# Patient Record
Sex: Male | Born: 1945 | Race: White | Hispanic: No | Marital: Married | Smoking: Former smoker
Health system: Southern US, Community
[De-identification: ages and names within clinical notes are randomized; demographics above are authoritative.]

## PROBLEM LIST (undated history)

## (undated) DIAGNOSIS — I10 Essential (primary) hypertension: Secondary | ICD-10-CM

## (undated) DIAGNOSIS — M1990 Unspecified osteoarthritis, unspecified site: Secondary | ICD-10-CM

## (undated) DIAGNOSIS — C801 Malignant (primary) neoplasm, unspecified: Secondary | ICD-10-CM

## (undated) HISTORY — PX: PROSTATECTOMY: SHX69

## (undated) HISTORY — PX: APPENDECTOMY: SHX54

## (undated) HISTORY — PX: HERNIA REPAIR: SHX51

## (undated) HISTORY — PX: SKIN CANCER EXCISION: SHX779

## (undated) HISTORY — PX: NASAL POLYP SURGERY: SHX186

---

## 2015-08-08 ENCOUNTER — Other Ambulatory Visit (HOSPITAL_COMMUNITY): Payer: Self-pay | Admitting: Neurological Surgery

## 2015-08-17 ENCOUNTER — Encounter (HOSPITAL_COMMUNITY)
Admission: RE | Admit: 2015-08-17 | Discharge: 2015-08-17 | Disposition: A | Discharge status: Home / Self Care | Payer: Medicare Other | Source: Ambulatory Visit | Attending: Neurological Surgery | Admitting: Neurological Surgery

## 2015-08-17 ENCOUNTER — Ambulatory Visit (HOSPITAL_COMMUNITY)
Admission: RE | Admit: 2015-08-17 | Discharge: 2015-08-17 | Disposition: A | Discharge status: Home / Self Care | Payer: Medicare Other | Source: Ambulatory Visit | Attending: Neurological Surgery | Admitting: Neurological Surgery

## 2015-08-17 ENCOUNTER — Encounter (HOSPITAL_COMMUNITY): Payer: Self-pay

## 2015-08-17 DIAGNOSIS — I4510 Unspecified right bundle-branch block: Secondary | ICD-10-CM | POA: Diagnosis not present

## 2015-08-17 DIAGNOSIS — Z01810 Encounter for preprocedural cardiovascular examination: Secondary | ICD-10-CM | POA: Insufficient documentation

## 2015-08-17 DIAGNOSIS — R9431 Abnormal electrocardiogram [ECG] [EKG]: Secondary | ICD-10-CM | POA: Insufficient documentation

## 2015-08-17 DIAGNOSIS — I700 Atherosclerosis of aorta: Secondary | ICD-10-CM | POA: Insufficient documentation

## 2015-08-17 DIAGNOSIS — Z01812 Encounter for preprocedural laboratory examination: Secondary | ICD-10-CM | POA: Diagnosis not present

## 2015-08-17 DIAGNOSIS — Z01818 Encounter for other preprocedural examination: Secondary | ICD-10-CM | POA: Diagnosis not present

## 2015-08-17 DIAGNOSIS — Z87891 Personal history of nicotine dependence: Secondary | ICD-10-CM | POA: Diagnosis not present

## 2015-08-17 DIAGNOSIS — I441 Atrioventricular block, second degree: Secondary | ICD-10-CM | POA: Insufficient documentation

## 2015-08-17 DIAGNOSIS — M4802 Spinal stenosis, cervical region: Secondary | ICD-10-CM

## 2015-08-17 HISTORY — DX: Malignant (primary) neoplasm, unspecified: C80.1

## 2015-08-17 HISTORY — DX: Essential (primary) hypertension: I10

## 2015-08-17 HISTORY — DX: Unspecified osteoarthritis, unspecified site: M19.90

## 2015-08-17 LAB — CBC WITH DIFFERENTIAL/PLATELET
BASOS ABS: 0.1 10*3/uL (ref 0.0–0.1)
BASOS PCT: 0 %
EOS ABS: 0.2 10*3/uL (ref 0.0–0.7)
EOS PCT: 2 %
HCT: 43.4 % (ref 39.0–52.0)
HEMOGLOBIN: 14.5 g/dL (ref 13.0–17.0)
Lymphocytes Relative: 11 %
Lymphs Abs: 1.6 10*3/uL (ref 0.7–4.0)
MCH: 30.8 pg (ref 26.0–34.0)
MCHC: 33.4 g/dL (ref 30.0–36.0)
MCV: 92.1 fL (ref 78.0–100.0)
Monocytes Absolute: 1 10*3/uL (ref 0.1–1.0)
Monocytes Relative: 7 %
NEUTROS PCT: 80 %
Neutro Abs: 11.3 10*3/uL — ABNORMAL HIGH (ref 1.7–7.7)
PLATELETS: 263 10*3/uL (ref 150–400)
RBC: 4.71 MIL/uL (ref 4.22–5.81)
RDW: 15.3 % (ref 11.5–15.5)
WBC: 14.1 10*3/uL — AB (ref 4.0–10.5)

## 2015-08-17 LAB — BASIC METABOLIC PANEL
Anion gap: 11 (ref 5–15)
BUN: 21 mg/dL — AB (ref 6–20)
CALCIUM: 9.1 mg/dL (ref 8.9–10.3)
CO2: 25 mmol/L (ref 22–32)
CREATININE: 1.22 mg/dL (ref 0.61–1.24)
Chloride: 103 mmol/L (ref 101–111)
GFR, EST NON AFRICAN AMERICAN: 58 mL/min — AB (ref >60)
Glucose, Bld: 107 mg/dL — ABNORMAL HIGH (ref 65–99)
Potassium: 3.7 mmol/L (ref 3.5–5.1)
SODIUM: 139 mmol/L (ref 135–145)

## 2015-08-17 LAB — SURGICAL PCR SCREEN
MRSA, PCR: NEGATIVE
STAPHYLOCOCCUS AUREUS: NEGATIVE

## 2015-08-17 LAB — PROTIME-INR
INR: 1 (ref 0.00–1.49)
PROTHROMBIN TIME: 13.4 s (ref 11.6–15.2)

## 2015-08-17 NOTE — Pre-Procedure Instructions (Signed)
ATILLA BARRACLOUGH  08/17/2015      [id number] - [place], [place] - [place] AT [facility] OF [address] [place] [address] Phone: [phone] Fax: [phone]    Your procedure is scheduled on  Tuesday  08/25/15  Report to [facility] Admitting at 920 A.M.  Call this number if you have problems the morning of surgery:  [phone]   Remember:  Do not eat food or drink liquids after midnight.  Take these medicines the morning of surgery with A SIP OF WATER   Felodipine (plendil), metoprolol (toprol), singulair, prednisone ,potassium                                                                                                           (NO ASPIRIN OR ASPIRIN PRODUCTS, ADVIL/ IBUPROFEN/ MOTRIN, VITAMINS, HERBAL MEDICINES)                                Do not wear jewelry, make-up or nail polish.  Do not wear lotions, powders, or perfumes.  You may wear deodorant.  Do not shave 48 hours prior to surgery.  Men may shave face and neck.  Do not bring valuables to the hospital.  [facility] is not responsible for any belongings or valuables.  Contacts, dentures or bridgework may not be worn into surgery.  Leave your suitcase in the car.  After surgery it may be brought to your room.  For patients admitted to the hospital, discharge time will be determined by your treatment team.  Patients discharged the day of surgery will not be allowed to drive home.   Name and phone number of your driver:    Special instructions:  [place] - Preparing for Surgery  Before surgery, you can play an important role.  Because skin is not sterile, your skin needs to be as free of germs as possible.  You can reduce the number of germs on you skin by washing with CHG (chlorahexidine gluconate) soap before surgery.  CHG is an antiseptic cleaner which kills germs and bonds with the skin to continue killing germs even after washing.  Please DO NOT  use if you have an allergy to CHG or antibacterial soaps.  If your skin becomes reddened/irritated stop using the CHG and inform your nurse when you arrive at Short Stay.  Do not shave (including legs and underarms) for at least 48 hours prior to the first CHG shower.  You may shave your face.  Please follow these instructions carefully:   1.  Shower with CHG Soap the night before surgery and the                                morning of Surgery.  2.  If you choose to wash your hair, wash your hair first as usual with your  normal shampoo.  3.  After you shampoo, rinse your hair and body thoroughly to remove the                      Shampoo.  4.  Use CHG as you would any other liquid soap.  You can apply chg directly       to the skin and wash gently with scrungie or a clean washcloth.  5.  Apply the CHG Soap to your body ONLY FROM THE NECK DOWN.        Do not use on open wounds or open sores.  Avoid contact with your eyes,       ears, mouth and genitals (private parts).  Wash genitals (private parts)       with your normal soap.  6.  Wash thoroughly, paying special attention to the area where your surgery        will be performed.  7.  Thoroughly rinse your body with warm water from the neck down.  8.  DO NOT shower/wash with your normal soap after using and rinsing off       the CHG Soap.  9.  Pat yourself dry with a clean towel.            10.  Wear clean pajamas.            11.  Place clean sheets on your bed the night of your first shower and do not        sleep with pets.  Day of Surgery  Do not apply any lotions/deoderants the morning of surgery.  Please wear clean clothes to the hospital/surgery center.    Please read over the following fact sheets that you were given. Pain Booklet, Coughing and Deep Breathing, MRSA Information and Surgical Site Infection Prevention

## 2015-08-17 NOTE — Progress Notes (Signed)
   08/17/15 1439  OBSTRUCTIVE SLEEP APNEA  Have you ever been diagnosed with sleep apnea through a sleep study? No  Do you snore loudly (loud enough to be heard through closed doors)?  1  Do you often feel tired, fatigued, or sleepy during the daytime (such as falling asleep during driving or talking to someone)? 0  Has anyone observed you stop breathing during your sleep? 0  Do you have, or are you being treated for high blood pressure? 1  BMI more than 35 kg/m2? 0  Age > 50 (1-yes) 1  Neck circumference greater than:Male 16 inches or larger, Male 17inches or larger? 1  Male Gender (Yes=1) 1  Obstructive Sleep Apnea Score 5  Score 5 or greater  Results sent to PCP

## 2015-08-18 NOTE — Progress Notes (Addendum)
Anesthesia Chart Review:  Pt is a 70 year old male scheduled for C5-6, C6-7 ADCF on 08/25/2015 with Dr. Ronnald Ramp.   PCP is Dr. Alma Friendly (care everywhere)  PMH includes:  HTN, prostate cancer, RA. Former smoker. BMI 30.   Medications include: lipitor, felodipine, methotrexate, metoprolol, potassium, predisone, valsartan-hctz.   Preoperative labs reviewed.    Chest x-ray 08/17/15 reviewed.  1. No acute cardiopulmonary abnormality. 2. Some chronic pulmonary hyperinflation and interstitial changes are suspected. Calcified aortic atherosclerosis.  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 By notes in care everywhere, pt had EKG 04/16/14 that showed 1st degree AV block, RBBB, inferior infarct. Attempting to get copy of tracing.   Reviewed case with Dr. Kalman Shan.   If no changes, I anticipate pt can proceed with surgery as scheduled.   Willeen Cass, FNP-BC [facility] Short Stay Surgical Center/Anesthesiology Phone: [phone] 08/23/2015 4:54 PM

## 2015-08-24 MED ORDER — CEFAZOLIN SODIUM-DEXTROSE 2-3 GM-% IV SOLR
2.0000 g | INTRAVENOUS | Status: AC
  Administered 2015-08-25: 2 g via INTRAVENOUS
  Filled 2015-08-24: qty 50

## 2015-08-25 ENCOUNTER — Inpatient Hospital Stay (HOSPITAL_COMMUNITY): Payer: Medicare Other

## 2015-08-25 ENCOUNTER — Encounter (HOSPITAL_COMMUNITY)
Admission: RE | Disposition: A | Discharge status: Home / Self Care | Payer: Self-pay | Source: Ambulatory Visit | Attending: Neurological Surgery

## 2015-08-25 ENCOUNTER — Inpatient Hospital Stay (HOSPITAL_COMMUNITY)
Admission: RE | Admit: 2015-08-25 | Discharge: 2015-08-26 | DRG: 473 | Disposition: A | Discharge status: Home / Self Care | Payer: Medicare Other | Source: Ambulatory Visit | Attending: Neurological Surgery | Admitting: Neurological Surgery

## 2015-08-25 ENCOUNTER — Encounter (HOSPITAL_COMMUNITY): Payer: Self-pay | Admitting: Surgery

## 2015-08-25 ENCOUNTER — Inpatient Hospital Stay (HOSPITAL_COMMUNITY): Payer: Medicare Other | Admitting: Emergency Medicine

## 2015-08-25 ENCOUNTER — Inpatient Hospital Stay (HOSPITAL_COMMUNITY): Payer: Medicare Other | Admitting: Certified Registered"

## 2015-08-25 DIAGNOSIS — Z87891 Personal history of nicotine dependence: Secondary | ICD-10-CM | POA: Diagnosis not present

## 2015-08-25 DIAGNOSIS — Z886 Allergy status to analgesic agent status: Secondary | ICD-10-CM

## 2015-08-25 DIAGNOSIS — M50222 Other cervical disc displacement at C5-C6 level: Secondary | ICD-10-CM | POA: Diagnosis present

## 2015-08-25 DIAGNOSIS — M47812 Spondylosis without myelopathy or radiculopathy, cervical region: Principal | ICD-10-CM | POA: Diagnosis present

## 2015-08-25 DIAGNOSIS — I10 Essential (primary) hypertension: Secondary | ICD-10-CM | POA: Diagnosis present

## 2015-08-25 DIAGNOSIS — Z7952 Long term (current) use of systemic steroids: Secondary | ICD-10-CM | POA: Diagnosis not present

## 2015-08-25 DIAGNOSIS — Z419 Encounter for procedure for purposes other than remedying health state, unspecified: Secondary | ICD-10-CM

## 2015-08-25 DIAGNOSIS — M4802 Spinal stenosis, cervical region: Secondary | ICD-10-CM | POA: Diagnosis present

## 2015-08-25 DIAGNOSIS — M50223 Other cervical disc displacement at C6-C7 level: Secondary | ICD-10-CM | POA: Diagnosis present

## 2015-08-25 DIAGNOSIS — Z8546 Personal history of malignant neoplasm of prostate: Secondary | ICD-10-CM

## 2015-08-25 DIAGNOSIS — M069 Rheumatoid arthritis, unspecified: Secondary | ICD-10-CM | POA: Diagnosis not present

## 2015-08-25 DIAGNOSIS — Z981 Arthrodesis status: Secondary | ICD-10-CM

## 2015-08-25 DIAGNOSIS — M542 Cervicalgia: Secondary | ICD-10-CM | POA: Diagnosis present

## 2015-08-25 HISTORY — PX: ANTERIOR CERVICAL DECOMP/DISCECTOMY FUSION: SHX1161

## 2015-08-25 SURGERY — ANTERIOR CERVICAL DECOMPRESSION/DISCECTOMY FUSION 2 LEVELS
Anesthesia: General

## 2015-08-25 MED ORDER — NEOSTIGMINE METHYLSULFATE 10 MG/10ML IV SOLN
INTRAVENOUS | Status: DC | PRN
  Administered 2015-08-25: 2 mg via INTRAVENOUS

## 2015-08-25 MED ORDER — POTASSIUM CHLORIDE IN NACL 20-0.9 MEQ/L-% IV SOLN
INTRAVENOUS | Status: DC
  Filled 2015-08-25 (×3): qty 1000

## 2015-08-25 MED ORDER — ESMOLOL HCL 100 MG/10ML IV SOLN
INTRAVENOUS | Status: AC
  Filled 2015-08-25: qty 10

## 2015-08-25 MED ORDER — ACETAMINOPHEN 325 MG PO TABS: 650.0000 mg | ORAL_TABLET | ORAL | Status: DC | PRN

## 2015-08-25 MED ORDER — HYDROMORPHONE HCL 1 MG/ML IJ SOLN
0.2500 mg | INTRAMUSCULAR | Status: DC | PRN
  Administered 2015-08-25 (×3): 0.5 mg via INTRAVENOUS

## 2015-08-25 MED ORDER — CEFAZOLIN SODIUM 1-5 GM-% IV SOLN
1.0000 g | Freq: Three times a day (TID) | INTRAVENOUS | Status: AC
  Administered 2015-08-25 – 2015-08-26 (×2): 1 g via INTRAVENOUS
  Filled 2015-08-25 (×2): qty 50

## 2015-08-25 MED ORDER — ONDANSETRON HCL 4 MG/2ML IJ SOLN
INTRAMUSCULAR | Status: AC
  Filled 2015-08-25: qty 2

## 2015-08-25 MED ORDER — SODIUM CHLORIDE 0.9% FLUSH
3.0000 mL | Freq: Two times a day (BID) | INTRAVENOUS | Status: DC
  Administered 2015-08-25: 3 mL via INTRAVENOUS

## 2015-08-25 MED ORDER — LIDOCAINE HCL (CARDIAC) 20 MG/ML IV SOLN
INTRAVENOUS | Status: AC
  Filled 2015-08-25: qty 5

## 2015-08-25 MED ORDER — GLYCOPYRROLATE 0.2 MG/ML IJ SOLN
INTRAMUSCULAR | Status: AC
  Filled 2015-08-25: qty 2

## 2015-08-25 MED ORDER — FENTANYL CITRATE (PF) 100 MCG/2ML IJ SOLN
INTRAMUSCULAR | Status: DC | PRN
  Administered 2015-08-25: 150 ug via INTRAVENOUS
  Administered 2015-08-25 (×2): 100 ug via INTRAVENOUS

## 2015-08-25 MED ORDER — ALBUTEROL SULFATE HFA 108 (90 BASE) MCG/ACT IN AERS
INHALATION_SPRAY | RESPIRATORY_TRACT | Status: DC | PRN
  Administered 2015-08-25: 2 via RESPIRATORY_TRACT

## 2015-08-25 MED ORDER — PROPOFOL 10 MG/ML IV BOLUS
INTRAVENOUS | Status: AC
  Filled 2015-08-25: qty 20

## 2015-08-25 MED ORDER — PHENOL 1.4 % MT LIQD: 1.0000 | OROMUCOSAL | Status: DC | PRN

## 2015-08-25 MED ORDER — LIDOCAINE HCL 4 % EX SOLN
CUTANEOUS | Status: DC | PRN
  Administered 2015-08-25: 4 mL via TOPICAL

## 2015-08-25 MED ORDER — ROCURONIUM BROMIDE 100 MG/10ML IV SOLN
INTRAVENOUS | Status: DC | PRN
  Administered 2015-08-25: 50 mg via INTRAVENOUS

## 2015-08-25 MED ORDER — OXYCODONE-ACETAMINOPHEN 5-325 MG PO TABS: 1.0000 | ORAL_TABLET | ORAL | Status: DC | PRN

## 2015-08-25 MED ORDER — NEOSTIGMINE METHYLSULFATE 10 MG/10ML IV SOLN
INTRAVENOUS | Status: AC
  Filled 2015-08-25: qty 1

## 2015-08-25 MED ORDER — SODIUM CHLORIDE 0.9 % IR SOLN
Status: DC | PRN
  Administered 2015-08-25: 11:00:00

## 2015-08-25 MED ORDER — DEXAMETHASONE SODIUM PHOSPHATE 10 MG/ML IJ SOLN
10.0000 mg | INTRAMUSCULAR | Status: AC
  Administered 2015-08-25: 10 mg via INTRAVENOUS
  Filled 2015-08-25: qty 1

## 2015-08-25 MED ORDER — DEXTROSE 5 % IV SOLN
500.0000 mg | Freq: Four times a day (QID) | INTRAVENOUS | Status: DC | PRN
  Filled 2015-08-25: qty 5

## 2015-08-25 MED ORDER — ATORVASTATIN CALCIUM 20 MG PO TABS
10.0000 mg | ORAL_TABLET | Freq: Every day | ORAL | Status: DC
  Administered 2015-08-25: 10 mg via ORAL
  Filled 2015-08-25: qty 1

## 2015-08-25 MED ORDER — GELATIN ABSORBABLE MT POWD
OROMUCOSAL | Status: DC | PRN
  Administered 2015-08-25: 11:00:00 via TOPICAL

## 2015-08-25 MED ORDER — HYDROCODONE-ACETAMINOPHEN 5-325 MG PO TABS
1.0000 | ORAL_TABLET | ORAL | Status: DC | PRN
  Administered 2015-08-25 – 2015-08-26 (×2): 2 via ORAL
  Filled 2015-08-25 (×2): qty 2

## 2015-08-25 MED ORDER — MONTELUKAST SODIUM 10 MG PO TABS
10.0000 mg | ORAL_TABLET | Freq: Every day | ORAL | Status: DC
  Filled 2015-08-25: qty 1

## 2015-08-25 MED ORDER — FELODIPINE ER 10 MG PO TB24
10.0000 mg | ORAL_TABLET | Freq: Every day | ORAL | Status: DC
  Administered 2015-08-26: 10 mg via ORAL
  Filled 2015-08-25 (×2): qty 1

## 2015-08-25 MED ORDER — VALSARTAN-HYDROCHLOROTHIAZIDE 320-25 MG PO TABS: 1.0000 | ORAL_TABLET | Freq: Every day | ORAL | Status: DC

## 2015-08-25 MED ORDER — PROMETHAZINE HCL 25 MG/ML IJ SOLN: 6.2500 mg | INTRAMUSCULAR | Status: DC | PRN

## 2015-08-25 MED ORDER — IRBESARTAN 300 MG PO TABS
300.0000 mg | ORAL_TABLET | Freq: Every day | ORAL | Status: DC
  Administered 2015-08-25 – 2015-08-26 (×2): 300 mg via ORAL
  Filled 2015-08-25 (×3): qty 1

## 2015-08-25 MED ORDER — THROMBIN 5000 UNITS EX SOLR
CUTANEOUS | Status: DC | PRN
  Administered 2015-08-25 (×2): 5000 [IU] via TOPICAL

## 2015-08-25 MED ORDER — MENTHOL 3 MG MT LOZG: 1.0000 | LOZENGE | OROMUCOSAL | Status: DC | PRN

## 2015-08-25 MED ORDER — SODIUM CHLORIDE 0.9% FLUSH: 3.0000 mL | INTRAVENOUS | Status: DC | PRN

## 2015-08-25 MED ORDER — ALBUTEROL SULFATE HFA 108 (90 BASE) MCG/ACT IN AERS
INHALATION_SPRAY | RESPIRATORY_TRACT | Status: AC
  Filled 2015-08-25: qty 6.7

## 2015-08-25 MED ORDER — PROPOFOL 10 MG/ML IV BOLUS
INTRAVENOUS | Status: DC | PRN
  Administered 2015-08-25: 200 mg via INTRAVENOUS

## 2015-08-25 MED ORDER — ONDANSETRON HCL 4 MG/2ML IJ SOLN
INTRAMUSCULAR | Status: DC | PRN
  Administered 2015-08-25: 4 mg via INTRAVENOUS

## 2015-08-25 MED ORDER — ESMOLOL HCL 100 MG/10ML IV SOLN
INTRAVENOUS | Status: DC | PRN
  Administered 2015-08-25: 20 mg via INTRAVENOUS

## 2015-08-25 MED ORDER — BUPIVACAINE HCL (PF) 0.25 % IJ SOLN
INTRAMUSCULAR | Status: DC | PRN
  Administered 2015-08-25: 4 mL

## 2015-08-25 MED ORDER — 0.9 % SODIUM CHLORIDE (POUR BTL) OPTIME
TOPICAL | Status: DC | PRN
  Administered 2015-08-25: 1000 mL

## 2015-08-25 MED ORDER — PREDNISONE 10 MG PO TABS: 5.0000 mg | ORAL_TABLET | Freq: Every day | ORAL | Status: DC | PRN

## 2015-08-25 MED ORDER — ROCURONIUM BROMIDE 50 MG/5ML IV SOLN
INTRAVENOUS | Status: AC
  Filled 2015-08-25: qty 1

## 2015-08-25 MED ORDER — METHOCARBAMOL 500 MG PO TABS
500.0000 mg | ORAL_TABLET | Freq: Four times a day (QID) | ORAL | Status: DC | PRN
  Administered 2015-08-25: 500 mg via ORAL
  Filled 2015-08-25: qty 1

## 2015-08-25 MED ORDER — METHOTREXATE 2.5 MG PO TABS: 10.0000 mg | ORAL_TABLET | ORAL | Status: DC

## 2015-08-25 MED ORDER — HEMOSTATIC AGENTS (NO CHARGE) OPTIME
TOPICAL | Status: DC | PRN
  Administered 2015-08-25: 1 via TOPICAL

## 2015-08-25 MED ORDER — HYDROMORPHONE HCL 1 MG/ML IJ SOLN
INTRAMUSCULAR | Status: AC
  Filled 2015-08-25: qty 1

## 2015-08-25 MED ORDER — MORPHINE SULFATE (PF) 2 MG/ML IV SOLN
1.0000 mg | INTRAVENOUS | Status: DC | PRN
  Administered 2015-08-25: 4 mg via INTRAVENOUS
  Filled 2015-08-25: qty 2

## 2015-08-25 MED ORDER — LIDOCAINE HCL (CARDIAC) 20 MG/ML IV SOLN
INTRAVENOUS | Status: DC | PRN
  Administered 2015-08-25: 80 mg via INTRAVENOUS

## 2015-08-25 MED ORDER — MIDAZOLAM HCL 2 MG/2ML IJ SOLN
INTRAMUSCULAR | Status: AC
  Filled 2015-08-25: qty 2

## 2015-08-25 MED ORDER — FENTANYL CITRATE (PF) 250 MCG/5ML IJ SOLN
INTRAMUSCULAR | Status: AC
  Filled 2015-08-25: qty 5

## 2015-08-25 MED ORDER — METOPROLOL SUCCINATE ER 25 MG PO TB24
50.0000 mg | ORAL_TABLET | Freq: Every day | ORAL | Status: DC
  Administered 2015-08-26: 50 mg via ORAL
  Filled 2015-08-25 (×2): qty 2

## 2015-08-25 MED ORDER — ONDANSETRON HCL 4 MG/2ML IJ SOLN: 4.0000 mg | INTRAMUSCULAR | Status: DC | PRN

## 2015-08-25 MED ORDER — HYDROCHLOROTHIAZIDE 25 MG PO TABS
25.0000 mg | ORAL_TABLET | Freq: Every day | ORAL | Status: DC
  Administered 2015-08-25 – 2015-08-26 (×2): 25 mg via ORAL
  Filled 2015-08-25 (×2): qty 1

## 2015-08-25 MED ORDER — ACETAMINOPHEN 650 MG RE SUPP: 650.0000 mg | RECTAL | Status: DC | PRN

## 2015-08-25 MED ORDER — MIDAZOLAM HCL 5 MG/5ML IJ SOLN
INTRAMUSCULAR | Status: DC | PRN
Start: 2015-08-25 — End: 2015-08-25
  Administered 2015-08-25: 2 mg via INTRAVENOUS

## 2015-08-25 MED ORDER — LACTATED RINGERS IV SOLN
INTRAVENOUS | Status: DC
  Administered 2015-08-25 (×2): via INTRAVENOUS

## 2015-08-25 MED ORDER — GLYCOPYRROLATE 0.2 MG/ML IJ SOLN
INTRAMUSCULAR | Status: DC | PRN
  Administered 2015-08-25: 0.2 mg via INTRAVENOUS
  Administered 2015-08-25: 0.3 mg via INTRAVENOUS

## 2015-08-25 SURGICAL SUPPLY — 47 items
BAG DECANTER FOR FLEXI CONT (MISCELLANEOUS) ×3 IMPLANT
BENZOIN TINCTURE PRP APPL 2/3 (GAUZE/BANDAGES/DRESSINGS) ×3 IMPLANT
BIT DRILL POWER (BIT) ×1 IMPLANT
BONE MATRIX OSTEOCEL PRO SM (Bone Implant) ×3 IMPLANT
BUR MATCHSTICK NEURO 3.0 LAGG (BURR) ×3 IMPLANT
CAGE COROENT SM 10X13X15 (Cage) ×2 IMPLANT
CAGE COROENT SM 10X13X15MM (Cage) ×1 IMPLANT
CAGE LORDOTIC 8 SM PLUS (Cage) ×3 IMPLANT
CANISTER SUCT 3000ML PPV (MISCELLANEOUS) ×3 IMPLANT
CLOSURE WOUND 1/2 X4 (GAUZE/BANDAGES/DRESSINGS) ×1
DRAPE C-ARM 42X72 X-RAY (DRAPES) ×6 IMPLANT
DRAPE LAPAROTOMY 100X72 PEDS (DRAPES) ×3 IMPLANT
DRAPE MICROSCOPE LEICA (MISCELLANEOUS) ×3 IMPLANT
DRAPE POUCH INSTRU U-SHP 10X18 (DRAPES) ×3 IMPLANT
DRILL BIT POWER (BIT) ×2
DRSG OPSITE POSTOP 3X4 (GAUZE/BANDAGES/DRESSINGS) ×3 IMPLANT
DURAPREP 6ML APPLICATOR 50/CS (WOUND CARE) ×3 IMPLANT
ELECT COATED BLADE 2.86 ST (ELECTRODE) ×3 IMPLANT
ELECT REM PT RETURN 9FT ADLT (ELECTROSURGICAL) ×3
ELECTRODE REM PT RTRN 9FT ADLT (ELECTROSURGICAL) ×1 IMPLANT
GAUZE SPONGE 4X4 16PLY XRAY LF (GAUZE/BANDAGES/DRESSINGS) IMPLANT
GLOVE BIO SURGEON STRL SZ8 (GLOVE) ×3 IMPLANT
GOWN STRL REUS W/ TWL LRG LVL3 (GOWN DISPOSABLE) IMPLANT
GOWN STRL REUS W/ TWL XL LVL3 (GOWN DISPOSABLE) ×1 IMPLANT
GOWN STRL REUS W/TWL 2XL LVL3 (GOWN DISPOSABLE) IMPLANT
GOWN STRL REUS W/TWL LRG LVL3 (GOWN DISPOSABLE)
GOWN STRL REUS W/TWL XL LVL3 (GOWN DISPOSABLE) ×2
HEMOSTAT POWDER KIT SURGIFOAM (HEMOSTASIS) ×3 IMPLANT
KIT BASIN OR (CUSTOM PROCEDURE TRAY) ×3 IMPLANT
KIT ROOM TURNOVER OR (KITS) ×3 IMPLANT
NEEDLE HYPO 25X1 1.5 SAFETY (NEEDLE) ×3 IMPLANT
NEEDLE SPNL 20GX3.5 QUINCKE YW (NEEDLE) ×3 IMPLANT
NS IRRIG 1000ML POUR BTL (IV SOLUTION) ×3 IMPLANT
PACK LAMINECTOMY NEURO (CUSTOM PROCEDURE TRAY) ×3 IMPLANT
PAD ARMBOARD 7.5X6 YLW CONV (MISCELLANEOUS) ×9 IMPLANT
PIN DISTRACTION 14MM (PIN) ×6 IMPLANT
PLATE ARCHON 2-LEVEL 44MM (Plate) ×3 IMPLANT
RUBBERBAND STERILE (MISCELLANEOUS) ×6 IMPLANT
SCREW ARCHON SELFTAP 4.0X13 (Screw) ×18 IMPLANT
SPONGE INTESTINAL PEANUT (DISPOSABLE) ×3 IMPLANT
SPONGE SURGIFOAM ABS GEL SZ50 (HEMOSTASIS) ×3 IMPLANT
STRIP CLOSURE SKIN 1/2X4 (GAUZE/BANDAGES/DRESSINGS) ×2 IMPLANT
SUT VIC AB 3-0 SH 8-18 (SUTURE) ×6 IMPLANT
TOWEL OR 17X24 6PK STRL BLUE (TOWEL DISPOSABLE) ×3 IMPLANT
TOWEL OR 17X26 10 PK STRL BLUE (TOWEL DISPOSABLE) ×3 IMPLANT
TRAP SPECIMEN MUCOUS 40CC (MISCELLANEOUS) ×3 IMPLANT
WATER STERILE IRR 1000ML POUR (IV SOLUTION) ×3 IMPLANT

## 2015-08-25 NOTE — H&P (Signed)
Subjective:   Patient is a 70 y.o. male admitted for ACDF. The patient first presented to me with complaints of neck pain, shooting pains in the arm(s) and numbness of the arm(s). Onset of symptoms was several months ago. The pain is described as aching and occurs all day. The pain is rated severe, and is located  In the neck and radiates to the RUE. The symptoms have been progressive. Symptoms are exacerbated by extending head backwards, and are relieved by none.  Previous work up includes MRI of cervical spine, results: spinal stenosis.  Past Medical History  Diagnosis Date  . Hypertension   . Arthritis     rheumatoid       [place]  . Cancer [facility])     PROSTATE    Past Surgical History  Procedure Laterality Date  . Prostatectomy      5 YEARS  . Appendectomy    . Hernia repair      3 SURG  . Nasal polyp surgery      X3  . Skin cancer excision      SCALP    Allergies  Allergen Reactions  . Aspirin     Dr prefers for pt not to take    Social History  Substance Use Topics  . Smoking status: Former Research scientist (life sciences)  . Smokeless tobacco: Not on file  . Alcohol Use: No    History reviewed. No pertinent family history. Prior to Admission medications   Medication Sig Start Date End Date Taking? Authorizing Provider  atorvastatin (LIPITOR) 10 MG tablet Take 10 mg by mouth daily.   Yes Historical Provider, MD  felodipine (PLENDIL) 10 MG 24 hr tablet Take 10 mg by mouth daily.   Yes Historical Provider, MD  fluticasone (FLONASE) 50 MCG/ACT nasal spray Place 1 spray into both nostrils daily.   Yes Historical Provider, MD  folic acid (FOLVITE) 1 MG tablet Take 1 mg by mouth daily.   Yes Historical Provider, MD  metoprolol succinate (TOPROL-XL) 50 MG 24 hr tablet Take 1 tablet by mouth daily. 07/29/15  Yes Historical Provider, MD  montelukast (SINGULAIR) 10 MG tablet Take 1 tablet by mouth daily. 07/29/15  Yes Historical Provider, MD  potassium chloride SA (K-DUR,KLOR-CON) 20 MEQ tablet  Take 40 mEq by mouth daily.   Yes Historical Provider, MD  predniSONE (DELTASONE) 5 MG tablet Take 5 mg by mouth daily as needed (Joint pain).   Yes Historical Provider, MD  valsartan-hydrochlorothiazide (DIOVAN-HCT) 320-25 MG tablet Take 1 tablet by mouth daily. 07/24/15  Yes Historical Provider, MD  methotrexate (RHEUMATREX) 2.5 MG tablet Take 10 mg by mouth once a week. Caution:Chemotherapy. Protect from light.    Historical Provider, MD     Review of Systems  Positive ROS: neg  All other systems have been reviewed and were otherwise negative with the exception of those mentioned in the HPI and as above.  Objective: Vital signs in last 24 hours: Temp:  [97.9 F (36.6 C)] 97.9 F (36.6 C) (02/02 0918) Pulse Rate:  [93] 93 (02/02 0918) Resp:  [20] 20 (02/02 0918) BP: (159)/(71) 159/71 mmHg (02/02 0918) SpO2:  [97 %] 97 % (02/02 0918) Weight:  [86.41 kg (190 lb 8 oz)] 86.41 kg (190 lb 8 oz) (02/02 [id number])  General Appearance: Alert, cooperative, no distress, appears stated age Head: Normocephalic, without obvious abnormality, atraumatic Eyes: PERRL, conjunctiva/corneas clear, EOM's intact      Neck: Supple, symmetrical, trachea midline, Back: Symmetric, no curvature, ROM normal,  no CVA tenderness Lungs:  respirations unlabored Heart: Regular rate and rhythm Abdomen: Soft, non-tender Extremities: Extremities normal, atraumatic, no cyanosis or edema Pulses: 2+ and symmetric all extremities Skin: Skin color, texture, turgor normal, no rashes or lesions  NEUROLOGIC:  Mental status: Alert and oriented x4, no aphasia, good attention span, fund of knowledge and memory  Motor Exam - grossly normal Sensory Exam - grossly normal Reflexes: 1= Coordination - grossly normal Gait - grossly normal Balance - grossly normal Cranial Nerves: I: smell Not tested  II: visual acuity  OS: nl    OD: nl  II: visual fields Full to confrontation  II: pupils Equal, round, reactive to light  III,VII:  ptosis None  III,IV,VI: extraocular muscles  Full ROM  V: mastication Normal  V: facial light touch sensation  Normal  V,VII: corneal reflex  Present  VII: facial muscle function - upper  Normal  VII: facial muscle function - lower Normal  VIII: hearing Not tested  IX: soft palate elevation  Normal  IX,X: gag reflex Present  XI: trapezius strength  5/5  XI: sternocleidomastoid strength 5/5  XI: neck flexion strength  5/5  XII: tongue strength  Normal    Data Review Lab Results  Component Value Date   WBC 14.1* 08/17/2015   HGB 14.5 08/17/2015   HCT 43.4 08/17/2015   MCV 92.1 08/17/2015   PLT 263 08/17/2015   Lab Results  Component Value Date   NA 139 08/17/2015   K 3.7 08/17/2015   CL 103 08/17/2015   CO2 25 08/17/2015   BUN 21* 08/17/2015   CREATININE 1.22 08/17/2015   GLUCOSE 107* 08/17/2015   Lab Results  Component Value Date   INR 1.00 08/17/2015    Assessment:   Cervical neck pain with herniated nucleus pulposus/ spondylosis/ stenosis at C5-6, C6-7. Patient has failed conservative therapy. Planned surgery : ACDF C5-6, C6-7  Plan:   I explained the condition and procedure to the patient and answered any questions.  Patient wishes to proceed with procedure as planned. Understands risks/ benefits/ and expected or typical outcomes.  Jeff Lynn S 08/25/2015 10:04 AM

## 2015-08-25 NOTE — Anesthesia Procedure Notes (Signed)
Procedure Name: Intubation Date/Time: 08/25/2015 10:18 AM Performed by: Sampson Si E Pre-anesthesia Checklist: Patient identified, Emergency Drugs available, Suction available, Patient being monitored and Timeout performed Patient Re-evaluated:Patient Re-evaluated prior to inductionOxygen Delivery Method: Circle system utilized Preoxygenation: Pre-oxygenation with 100% oxygen Intubation Type: IV induction Ventilation: Mask ventilation without difficulty and Oral airway inserted - appropriate to patient size Laryngoscope Size: Mac and 3 Grade View: Grade II Tube type: Oral Tube size: 7.5 mm Number of attempts: 1 Airway Equipment and Method: Stylet Placement Confirmation: ETT inserted through vocal cords under direct vision,  positive ETCO2 and breath sounds checked- equal and bilateral Secured at: 22 cm Tube secured with: Tape Dental Injury: Teeth and Oropharynx as per pre-operative assessment

## 2015-08-25 NOTE — Anesthesia Preprocedure Evaluation (Addendum)
Anesthesia Evaluation  Patient identified by MRN, date of birth, ID band Patient awake    Reviewed: Allergy & Precautions, NPO status , Patient's Chart, lab work & pertinent test results  Airway Mallampati: II  TM Distance: >3 FB     Dental  (+) Teeth Intact, Dental Advisory Given   Pulmonary asthma , former smoker,    breath sounds clear to auscultation       Cardiovascular hypertension, Pt. on home beta blockers  Rhythm:Regular Rate:Normal     Neuro/Psych    GI/Hepatic   Endo/Other    Renal/GU      Musculoskeletal  (+) Arthritis , Rheumatoid disorders,    Abdominal   Peds  Hematology   Anesthesia Other Findings   Reproductive/Obstetrics                           Anesthesia Physical Anesthesia Plan  ASA: III  Anesthesia Plan: General   Post-op Pain Management:    Induction: Intravenous  Airway Management Planned: Oral ETT  Additional Equipment:   Intra-op Plan:   Post-operative Plan: Extubation in OR  Informed Consent: I have reviewed the patients History and Physical, chart, labs and discussed the procedure including the risks, benefits and alternatives for the proposed anesthesia with the patient or authorized representative who has indicated his/her understanding and acceptance.   Dental advisory given  Plan Discussed with:   Anesthesia Plan Comments:         Anesthesia Quick Evaluation

## 2015-08-25 NOTE — Anesthesia Postprocedure Evaluation (Signed)
Anesthesia Post Note  Patient: Jeff Lynn  Procedure(s) Performed: Procedure(s) (LRB): Anterior Cervical Decompression Discectomy Fusion - Cervical five-Cervical six - Cervical six-Cervical seven   (N/A)  Patient location during evaluation: PACU Anesthesia Type: General Level of consciousness: awake Pain management: pain level controlled Vital Signs Assessment: post-procedure vital signs reviewed and stable Respiratory status: spontaneous breathing Cardiovascular status: stable Anesthetic complications: no    Last Vitals:  Filed Vitals:   08/25/15 1315 08/25/15 1330  BP:  165/62  Pulse: 51   Temp:    Resp: 13     Last Pain:  Filed Vitals:   08/25/15 1332  PainSc: 4                  EDWARDS,Iasia Forcier

## 2015-08-25 NOTE — Transfer of Care (Signed)
Immediate Anesthesia Transfer of Care Note  Patient: Jeff Lynn  Procedure(s) Performed: Procedure(s): Anterior Cervical Decompression Discectomy Fusion - Cervical five-Cervical six - Cervical six-Cervical seven   (N/A)  Patient Location: PACU  Anesthesia Type:General  Level of Consciousness: awake, alert  and oriented  Airway & Oxygen Therapy: Patient Spontanous Breathing and Patient connected to nasal cannula oxygen  Post-op Assessment: Report given to RN and Patient moving all extremities X 4  Post vital signs: Reviewed and stable  Last Vitals:  Filed Vitals:   08/25/15 0918  BP: 159/71  Pulse: 93  Temp: 36.6 C  Resp: 20    Complications: No apparent anesthesia complications

## 2015-08-25 NOTE — Op Note (Signed)
08/25/2015  12:17 PM  PATIENT:  Jeff Lynn  70 y.o. male  PRE-OPERATIVE DIAGNOSIS:  Cervical spondylosis with cervical spinal stenosis, neck and right arm pain  POST-OPERATIVE DIAGNOSIS:  Same  PROCEDURE:  1. Decompressive anterior cervical discectomy C5-6 and C6-7, 2. Anterior cervical arthrodesis C5-6 and C6-7 utilizing peek interbody cages packed with local autograft and morcellized allograft, 3. Anterior cervical plating C5-6 C6-7 utilizing a Nuvasive archon plate  SURGEON:  Sherley Bounds, MD  ASSISTANTS: Ditty  ANESTHESIA:   General  EBL: 100 ml  Total I/O In: 1000 [I.V.:1000] Out: 100 [Blood:100]  BLOOD ADMINISTERED:none  DRAINS: none   SPECIMEN:  No Specimen  INDICATION FOR PROCEDURE: This patient presented with severe neck and right arm pain that is progressive. He had an MRI which showed spondylosis and stenosis C5-6 and C6-7. He tried medical management without relief. Recommended ACDF with plating. Patient understood the risks, benefits, and alternatives and potential outcomes and wished to proceed.  PROCEDURE DETAILS: Patient was brought to the operating room placed under general endotracheal anesthesia. Patient was placed in the supine position on the operating room table. The neck was prepped with Duraprep and draped in a sterile fashion.   Three cc of local anesthesia was injected and a transverse incision was made on the right side of the neck.  Dissection was carried down thru the subcutaneous tissue and the platysma was  elevated, opened, and undermined with Metzenbaum scissors.  Dissection was then carried out thru an avascular plane leaving the sternocleidomastoid carotid artery and jugular vein laterally and the trachea and esophagus medially. The ventral aspect of the vertebral column was identified and a localizing x-ray was taken. The C5-6 level was identified. The longus colli muscles were then elevated and the retractor was placed to expose C5-6 and C6-7.  The annulus was incised and the disc space entered. Discectomy was performed with micro-curettes and pituitary rongeurs. I then used the high-speed drill to drill the endplates down to the level of the posterior longitudinal ligament. The drill shavings were saved in a mucous trap for later arthrodesis. The operating microscope was draped and brought into the field provided additional magnification, illumination and visualization. Discectomy was continued posteriorly thru the disc space. Posterior longitudinal ligament was opened with a nerve hook, and then removed along with disc herniation and osteophytes, decompressing the spinal canal and thecal sac. We then continued to remove osteophytic overgrowth and disc material decompressing the neural foramina and exiting nerve roots bilaterally. The scope was angled up and down to help decompress and undercut the vertebral bodies. Once the decompression was completed we could pass a nerve hook circumferentially to assure adequate decompression in the midline and in the neural foramina. So by both visualization and palpation we felt we had an adequate decompression of the neural elements. We then measured the height of the intravertebral disc space and selected a 10 millimeter Peek interbody cage packed with autograft and morcellized allograft for C6-7 and an 8 mm graft was used at C5-6. It was then gently positioned in the intravertebral disc space and countersunk. I then used a 44 mm plate and placed variable angle screws into the vertebral bodies and locked them into position. The wound was irrigated with bacitracin solution, checked for hemostasis which was established and confirmed. Once meticulous hemostasis was achieved, we then proceeded with closure. The platysma was closed with interrupted 3-0 undyed Vicryl suture, the subcuticular layer was closed with interrupted 3-0 undyed Vicryl suture. The skin edges  were approximated with steristrips. The drapes were  removed. A sterile dressing was applied. The patient was then awakened from general anesthesia and transferred to the recovery room in stable condition. At the end of the procedure all sponge, needle and instrument counts were correct.   PLAN OF CARE: Admit to inpatient   PATIENT DISPOSITION:  PACU - hemodynamically stable.   Delay start of Pharmacological VTE agent (>24hrs) due to surgical blood loss or risk of bleeding:  yes

## 2015-08-26 ENCOUNTER — Encounter (HOSPITAL_COMMUNITY): Payer: Self-pay | Admitting: Neurological Surgery

## 2015-08-26 MED ORDER — HYDROCODONE-ACETAMINOPHEN 5-325 MG PO TABS
1.0000 | ORAL_TABLET | Freq: Four times a day (QID) | ORAL | Status: AC | PRN
Start: 2015-08-26 — End: ?

## 2015-08-26 NOTE — Discharge Summary (Signed)
Physician Discharge Summary  Patient ID: Jeff Lynn MRN: [id number] DOB/AGE: 08/19/1945 70 y.o.  Admit date: 08/25/2015 Discharge date: 08/26/2015  Admission Diagnoses: cervical stenosis    Discharge Diagnoses: same   Discharged Condition: good  Hospital Course: The patient was admitted on 08/25/2015 and taken to the operating room where the patient underwent ACDF. The patient tolerated the procedure well and was taken to the recovery room and then to the floor in stable condition. The hospital course was routine. There were no complications. The wound remained clean dry and intact. Pt had appropriate neck soreness. No complaints of arm pain or new N/T/W. The patient remained afebrile with stable vital signs, and tolerated a regular diet. The patient continued to increase activities, and pain was well controlled with oral pain medications.   Consults: None  Significant Diagnostic Studies:  Results for orders placed or performed during the hospital encounter of 08/17/15  Surgical pcr screen  Result Value Ref Range   MRSA, PCR NEGATIVE NEGATIVE   Staphylococcus aureus NEGATIVE NEGATIVE  Basic metabolic panel  Result Value Ref Range   Sodium 139 135 - 145 mmol/L   Potassium 3.7 3.5 - 5.1 mmol/L   Chloride 103 101 - 111 mmol/L   CO2 25 22 - 32 mmol/L   Glucose, Bld 107 (H) 65 - 99 mg/dL   BUN 21 (H) 6 - 20 mg/dL   Creatinine, Ser 1.22 0.61 - 1.24 mg/dL   Calcium 9.1 8.9 - 10.3 mg/dL   GFR calc non Af Amer 58 (L) >60 mL/min   GFR calc Af Amer >60 >60 mL/min   Anion gap 11 5 - 15  CBC WITH DIFFERENTIAL  Result Value Ref Range   WBC 14.1 (H) 4.0 - 10.5 K/uL   RBC 4.71 4.22 - 5.81 MIL/uL   Hemoglobin 14.5 13.0 - 17.0 g/dL   HCT 43.4 39.0 - 52.0 %   MCV 92.1 78.0 - 100.0 fL   MCH 30.8 26.0 - 34.0 pg   MCHC 33.4 30.0 - 36.0 g/dL   RDW 15.3 11.5 - 15.5 %   Platelets 263 150 - 400 K/uL   Neutrophils Relative % 80 %   Neutro Abs 11.3 (H) 1.7 - 7.7 K/uL   Lymphocytes Relative 11 %    Lymphs Abs 1.6 0.7 - 4.0 K/uL   Monocytes Relative 7 %   Monocytes Absolute 1.0 0.1 - 1.0 K/uL   Eosinophils Relative 2 %   Eosinophils Absolute 0.2 0.0 - 0.7 K/uL   Basophils Relative 0 %   Basophils Absolute 0.1 0.0 - 0.1 K/uL  Protime-INR  Result Value Ref Range   Prothrombin Time 13.4 11.6 - 15.2 seconds   INR 1.00 0.00 - 1.49    Chest 2 View  08/17/2015  CLINICAL DATA:  70 year old male preoperative study for cervical spine surgery in February. Former smoker. Initial encounter. EXAM: CHEST  2 VIEW COMPARISON:  No prior chest imaging.  Cervical spine MRI 07/06/2015. FINDINGS: Lung volumes are at the upper limits of normal to mildly hyperinflated. Mild diffuse increased interstitial markings appear likely chronic in nature. Cardiac size at the upper limits of normal. Calcified aortic atherosclerosis. Other mediastinal contours are within normal limits. Visualized tracheal air column is within normal limits. No pneumothorax, pulmonary edema, pleural effusion or confluent pulmonary opacity. No acute osseous abnormality identified. IMPRESSION: No acute cardiopulmonary abnormality. Some chronic pulmonary hyperinflation and interstitial changes are suspected. Calcified aortic atherosclerosis. Electronically Signed   By: Herminio Heads.D.  On: 08/17/2015 15:54   Dg Cervical Spine 2-3 Views  08/25/2015  CLINICAL DATA:  C5-7 ACDF.  Intraoperative films. EXAM: CERVICAL SPINE - 2-3 VIEW; DG C-ARM 61-120 MIN COMPARISON:  MRI cervical spine 07/06/2015. FINDINGS: We are provided with 2 fluoroscopic intraoperative spot views of the cervical spine. Images demonstrate anterior plate and screws interbody spacers in place from C5-7. Hardware is intact. Vertebral body height and alignment are normal. IMPRESSION: C5-7 ACDF.  No acute finding. Electronically Signed   By: Inge Rise M.D.   On: 08/25/2015 12:34   Dg C-arm 1-60 Min  08/25/2015  CLINICAL DATA:  C5-7 ACDF.  Intraoperative films. EXAM: CERVICAL SPINE  - 2-3 VIEW; DG C-ARM 61-120 MIN COMPARISON:  MRI cervical spine 07/06/2015. FINDINGS: We are provided with 2 fluoroscopic intraoperative spot views of the cervical spine. Images demonstrate anterior plate and screws interbody spacers in place from C5-7. Hardware is intact. Vertebral body height and alignment are normal. IMPRESSION: C5-7 ACDF.  No acute finding. Electronically Signed   By: Inge Rise M.D.   On: 08/25/2015 12:34    Antibiotics:  Anti-infectives    Start     Dose/Rate Route Frequency Ordered Stop   08/25/15 1800  ceFAZolin (ANCEF) IVPB 1 g/50 mL premix     1 g 100 mL/hr over 30 Minutes Intravenous Every 8 hours 08/25/15 1625 08/26/15 0206   08/25/15 1054  bacitracin 50,000 Units in sodium chloride irrigation 0.9 % 500 mL irrigation  Status:  Discontinued       As needed 08/25/15 1055 08/25/15 1217   08/25/15 0600  ceFAZolin (ANCEF) IVPB 2 g/50 mL premix     2 g 100 mL/hr over 30 Minutes Intravenous On call to O.R. 08/24/15 1237 08/25/15 1021      Discharge Exam: Blood pressure 168/84, pulse 92, temperature 98 F (36.7 C), temperature source Oral, resp. rate 18, height 5\' 7"  (1.702 m), weight 86.41 kg (190 lb 8 oz), SpO2 98 %. Neurologic: Grossly normal Dressing dry  Discharge Medications:     Medication List    ASK your doctor about these medications        atorvastatin 10 MG tablet  Commonly known as:  LIPITOR  Take 10 mg by mouth daily.     felodipine 10 MG 24 hr tablet  Commonly known as:  PLENDIL  Take 10 mg by mouth daily.     fluticasone 50 MCG/ACT nasal spray  Commonly known as:  FLONASE  Place 1 spray into both nostrils daily.     folic acid 1 MG tablet  Commonly known as:  FOLVITE  Take 1 mg by mouth daily.     methotrexate 2.5 MG tablet  Commonly known as:  RHEUMATREX  Take 10 mg by mouth once a week. Caution:Chemotherapy. Protect from light.     metoprolol succinate 50 MG 24 hr tablet  Commonly known as:  TOPROL-XL  Take 1 tablet by  mouth daily.     montelukast 10 MG tablet  Commonly known as:  SINGULAIR  Take 1 tablet by mouth daily.     potassium chloride SA 20 MEQ tablet  Commonly known as:  K-DUR,KLOR-CON  Take 40 mEq by mouth daily.     predniSONE 5 MG tablet  Commonly known as:  DELTASONE  Take 5 mg by mouth daily as needed (Joint pain).     valsartan-hydrochlorothiazide 320-25 MG tablet  Commonly known as:  DIOVAN-HCT  Take 1 tablet by mouth daily.  Disposition: home   Final Dx: ACDF       Signed: Takya Vandivier S 08/26/2015, 8:22 AM

## 2015-08-26 NOTE — Progress Notes (Signed)
Patient alert and oriented, mae's well, voiding adequate amount of urine, swallowing without difficulty, no c/o pain. Patient discharged home with family. Script and discharged instructions given to patient. Patient and family stated understanding of d/c instructions given and has an appointment with MD. 

## 2015-08-26 NOTE — Progress Notes (Signed)
Utilization review completed.  

## 2015-12-22 DEATH — deceased

## 2016-10-29 IMAGING — RF DG CERVICAL SPINE 2 OR 3 VIEWS
1 series · 2 of 2 positions shown · non-contrast
Comparison: MRI cervical spine 07/06/2015.

CLINICAL DATA: C5-7 ACDF.  Intraoperative films.

EXAM:
CERVICAL SPINE - 2-3 VIEW; DG C-ARM 61-120 MIN

[Series 1: run · 2 of 2 slices shown]
[im 1/2]
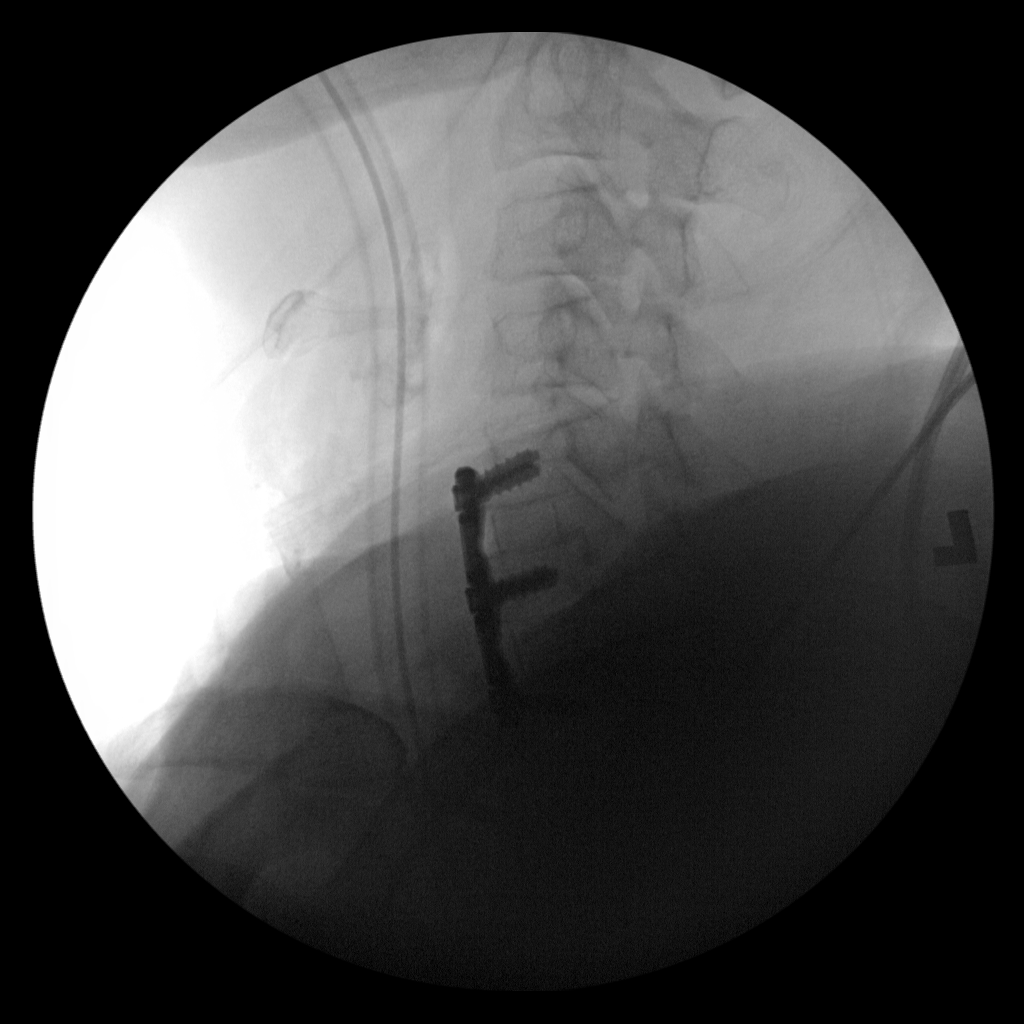
[im 2/2]
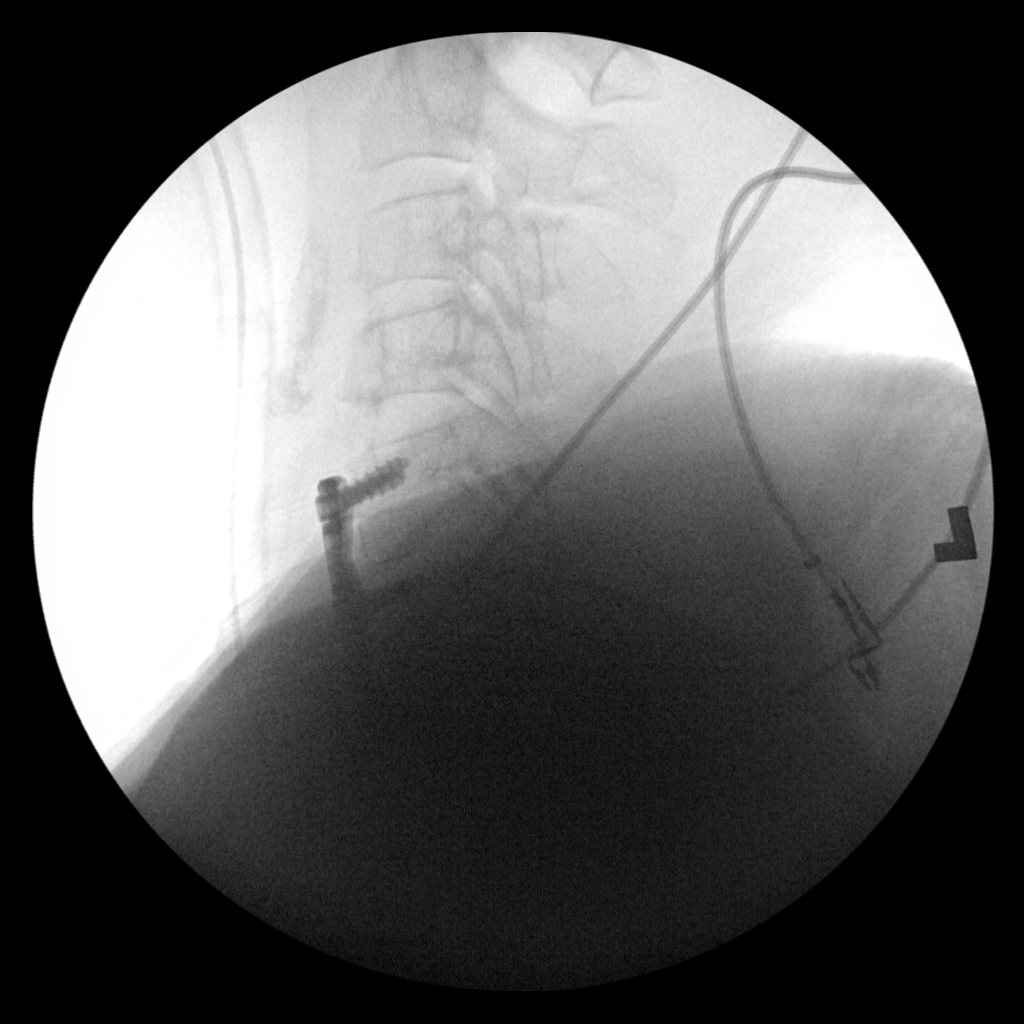

[2 of 2 positions shown; findings below may reference images not displayed]

FINDINGS: We are provided with 2 fluoroscopic intraoperative spot views of the
cervical spine. Images demonstrate anterior plate and screws
interbody spacers in place from C5-7. Hardware is intact. Vertebral
body height and alignment are normal.
IMPRESSION: C5-7 ACDF.  No acute finding.
